# Patient Record
Sex: Female | Born: 1990 | Race: White | Hispanic: No | Marital: Single | Smoking: Current every day smoker
Health system: Southern US, Community
[De-identification: ages and names within clinical notes are randomized; demographics above are authoritative.]

---

## 2017-07-05 ENCOUNTER — Other Ambulatory Visit: Payer: Self-pay

## 2017-07-05 ENCOUNTER — Encounter: Payer: Self-pay | Admitting: Emergency Medicine

## 2017-07-05 ENCOUNTER — Emergency Department: Payer: Medicaid Other

## 2017-07-05 ENCOUNTER — Emergency Department
Admission: EM | Admit: 2017-07-05 | Discharge: 2017-07-06 | Disposition: A | Discharge status: Home / Self Care | Payer: Medicaid Other | Attending: Emergency Medicine | Admitting: Emergency Medicine

## 2017-07-05 DIAGNOSIS — Y92410 Unspecified street and highway as the place of occurrence of the external cause: Secondary | ICD-10-CM | POA: Insufficient documentation

## 2017-07-05 DIAGNOSIS — Y9389 Activity, other specified: Secondary | ICD-10-CM | POA: Diagnosis not present

## 2017-07-05 DIAGNOSIS — Y999 Unspecified external cause status: Secondary | ICD-10-CM | POA: Insufficient documentation

## 2017-07-05 DIAGNOSIS — M542 Cervicalgia: Secondary | ICD-10-CM | POA: Diagnosis not present

## 2017-07-05 DIAGNOSIS — S098XXA Other specified injuries of head, initial encounter: Secondary | ICD-10-CM

## 2017-07-05 DIAGNOSIS — F17200 Nicotine dependence, unspecified, uncomplicated: Secondary | ICD-10-CM | POA: Diagnosis not present

## 2017-07-05 DIAGNOSIS — S0990XA Unspecified injury of head, initial encounter: Secondary | ICD-10-CM | POA: Diagnosis present

## 2017-07-05 DIAGNOSIS — S0083XA Contusion of other part of head, initial encounter: Secondary | ICD-10-CM | POA: Insufficient documentation

## 2017-07-05 MED ORDER — DIAZEPAM 5 MG PO TABS
5.0000 mg | ORAL_TABLET | Freq: Once | ORAL | Status: AC
Start: 2017-07-05 — End: 2017-07-06
  Administered 2017-07-06: 5 mg via ORAL
  Filled 2017-07-05: qty 1

## 2017-07-05 MED ORDER — MORPHINE SULFATE (PF) 4 MG/ML IV SOLN
4.0000 mg | Freq: Once | INTRAVENOUS | Status: AC
  Administered 2017-07-05: 4 mg via INTRAMUSCULAR
  Filled 2017-07-05: qty 1

## 2017-07-05 MED ORDER — DIAZEPAM 2 MG PO TABS: 2.0000 mg | ORAL_TABLET | Freq: Three times a day (TID) | ORAL | 0 refills | Status: AC | PRN

## 2017-07-05 NOTE — ED Notes (Signed)
c collar in place
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
## 2017-07-05 NOTE — Discharge Instructions (Addendum)
Avoid driving or drinking alcohol while taking Valium.

## 2017-07-06 NOTE — ED Notes (Signed)
Family at bed side to drive patient home
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
## 2019-04-13 ENCOUNTER — Emergency Department
Admission: EM | Admit: 2019-04-13 | Discharge: 2019-04-13 | Disposition: A | Discharge status: Home / Self Care | Payer: Medicaid Other | Attending: Emergency Medicine | Admitting: Emergency Medicine

## 2019-04-13 ENCOUNTER — Other Ambulatory Visit: Payer: Self-pay

## 2019-04-13 DIAGNOSIS — L03111 Cellulitis of right axilla: Secondary | ICD-10-CM | POA: Diagnosis present

## 2019-04-13 DIAGNOSIS — L03112 Cellulitis of left axilla: Secondary | ICD-10-CM | POA: Diagnosis not present

## 2019-04-13 DIAGNOSIS — F17210 Nicotine dependence, cigarettes, uncomplicated: Secondary | ICD-10-CM | POA: Insufficient documentation

## 2019-04-13 MED ORDER — TRAMADOL HCL 50 MG PO TABS: 50.0000 mg | ORAL_TABLET | Freq: Four times a day (QID) | ORAL | 0 refills | Status: AC | PRN

## 2019-04-13 MED ORDER — HYDROCODONE-ACETAMINOPHEN 5-325 MG PO TABS
1.0000 | ORAL_TABLET | Freq: Once | ORAL | Status: AC
  Administered 2019-04-13: 1 via ORAL
  Filled 2019-04-13: qty 1

## 2019-04-13 MED ORDER — SULFAMETHOXAZOLE-TRIMETHOPRIM 800-160 MG PO TABS
1.0000 | ORAL_TABLET | Freq: Once | ORAL | Status: AC
  Administered 2019-04-13: 1 via ORAL
  Filled 2019-04-13: qty 1

## 2019-04-13 MED ORDER — SULFAMETHOXAZOLE-TRIMETHOPRIM 800-160 MG PO TABS: 1.0000 | ORAL_TABLET | Freq: Two times a day (BID) | ORAL | 0 refills | Status: AC

## 2019-04-13 NOTE — ED Provider Notes (Signed)
[facility] Emergency Department Provider Note  ____________________________________________  Time seen: Approximately 9:30 PM  I have reviewed the triage vital signs and the nursing notes.   HISTORY  Chief Complaint Abscess    HPI Lydia Buck is a 29 y.o. female who presents the emergency department complaining of possible abscesses in bilateral axilla.  Patient states that for 5 days ago she used her boyfriend's deodorant.  Approximately 3 days ago she started noticing some redness and some soreness in bilateral axilla.  Patient states that these have worsened both in erythema as well as pain.  No drainage.  No history of recurrent skin infections.  No history of hidradenitis suppurative.  No medications for his complaint prior to arrival.  She denies any systemic complaints.         History reviewed. No pertinent past medical history.  There are no problems to display for this patient.   History reviewed. No pertinent surgical history.  Prior to Admission medications   Medication Sig Start Date End Date Taking? Authorizing Provider  sulfamethoxazole-trimethoprim (BACTRIM DS) 800-160 MG tablet Take 1 tablet by mouth 2 (two) times daily. 04/13/19   Shenouda Genova, Delorise Royals, PA-C  traMADol (ULTRAM) 50 MG tablet Take 1 tablet (50 mg total) by mouth every 6 (six) hours as needed. 04/13/19   Amyria Komar, Delorise Royals, PA-C    Allergies Patient has no known allergies.  No family history on file.  Social History Social History   Tobacco Use  . Smoking status: Current Every Day Smoker  . Smokeless tobacco: Never Used  Substance Use Topics  . Alcohol use: Not on file  . Drug use: Not on file     Review of Systems  Constitutional: No fever/chills Eyes: No visual changes. No discharge ENT: No upper respiratory complaints. Cardiovascular: no chest pain. Respiratory: no cough. No SOB. Gastrointestinal: No abdominal pain.  No nausea, no vomiting.    Musculoskeletal: Negative for musculoskeletal pain. Skin: Possible abscesses in bilateral axilla Neurological: Negative for headaches, focal weakness or numbness. 10-point ROS otherwise negative.  ____________________________________________   PHYSICAL EXAM:  VITAL SIGNS: ED Triage Vitals [04/13/19 1822]  Enc Vitals Group     BP 131/89     Pulse Rate 94     Resp (!) 22     Temp 98.5 F (36.9 C)     Temp Source Oral     SpO2 100 %     Weight 183 lb (83 kg)     Height 5\' 2"  (1.575 m)     Head Circumference      Peak Flow      Pain Score 10     Pain Loc      Pain Edu?      Excl. in GC?      Constitutional: Alert and oriented. Well appearing and in no acute distress. Eyes: Conjunctivae are normal. PERRL. EOMI. Head: Atraumatic. ENT:      Ears:       Nose: No congestion/rhinnorhea.      Mouth/Throat: Mucous membranes are moist.  Neck: No stridor.   Hematological/Lymphatic/Immunilogical: No cervical lymphadenopathy.  No appreciable axillary lymphadenopathy Cardiovascular: Normal rate, regular rhythm. Normal S1 and S2.  Good peripheral circulation. Respiratory: Normal respiratory effort without tachypnea or retractions. Lungs CTAB. Good air entry to the bases with no decreased or absent breath sounds. Musculoskeletal: Full range of motion to all extremities. No gross deformities appreciated. Neurologic:  Normal speech and language. No gross focal neurologic deficits are appreciated.  Skin:  Skin is warm, dry and intact. No rash noted.  Visualization of the left axilla reveals erythema, minimal edema.  Palpation reveals firmness with no areas of appreciable fluctuance or induration.  Total area of erythema and firmness measures approximately 5 cm in diameter.  No streaking.  No appreciable underlying lymphadenopathy.  Examination of the the right axilla reveals mild erythema.  No gross edema.  Palpation reveals mild firmness with no fluctuance or induration.  Total area measures  approximately 3 cm in diameter.  No streaking.  No appreciable underlying lymphadenopathy. Psychiatric: Mood and affect are normal. Speech and behavior are normal. Patient exhibits appropriate insight and judgement.   ____________________________________________   LABS (all labs ordered are listed, but only abnormal results are displayed)  Labs Reviewed - No data to display ____________________________________________  EKG   ____________________________________________  RADIOLOGY   No results found.  ____________________________________________    PROCEDURES  Procedure(s) performed:    Procedures    Medications  sulfamethoxazole-trimethoprim (BACTRIM DS) 800-160 MG per tablet 1 tablet (1 tablet Oral Given 04/13/19 2139)  HYDROcodone-acetaminophen (NORCO/VICODIN) 5-325 MG per tablet 1 tablet (1 tablet Oral Given 04/13/19 2139)     ____________________________________________   INITIAL IMPRESSION / ASSESSMENT AND PLAN / ED COURSE  Pertinent labs & imaging results that were available during my care of the patient were reviewed by me and considered in my medical decision making (see chart for details).  Review of the [place] CSRS was performed in accordance of the [place] prior to dispensing any controlled drugs.           Patient's diagnosis is consistent with cellulitis of bilateral axilla.  Patient presented to the emergency department concern for possible abscesses to both axilla.  Patient had used her boyfriend's deodorant, then developed worsening areas of erythema bilaterally.  No fluctuance or induration concerning for appreciable drainable abscess.  Patient does have evidence of cellulitis bilaterally however.  Patient will be started on antibiotics.  Return precautions are discussed with patient to include signs and symptoms of worsening infection, abscess formation.  Patient verbalizes understanding of same.  Patient will be prescribed Bactrim, Ultram and meloxicam  for symptom relief.  Follow-up with primary care as needed..  Patient is given ED precautions to return to the ED for any worsening or new symptoms.     ____________________________________________  FINAL CLINICAL IMPRESSION(S) / ED DIAGNOSES  Final diagnoses:  Cellulitis of right axilla  Cellulitis of left axilla      NEW MEDICATIONS STARTED DURING THIS VISIT:  ED Discharge Orders         Ordered    traMADol (ULTRAM) 50 MG tablet  Every 6 hours PRN     04/13/19 2143    sulfamethoxazole-trimethoprim (BACTRIM DS) 800-160 MG tablet  2 times daily     04/13/19 2143              This chart was dictated using voice recognition software/Dragon. Despite best efforts to proofread, errors can occur which can change the meaning. Any change was purely unintentional.    Darletta Moll, PA-C 04/13/19 2144    Vanessa [place], MD 04/13/19 2217

## 2019-04-13 NOTE — ED Triage Notes (Signed)
Reports bilateral abscesses in axilla X 3-4 days. Painful. Pt tearful, worse pain with movement. Pt alert and oriented X4, cooperative, RR even and unlabored, color WNL. Pt in NAD.
